# Patient Record
Sex: Male | Born: 1990 | Marital: Single | Smoking: Never smoker
Health system: Southern US, Community
[De-identification: ages and names within clinical notes are randomized; demographics above are authoritative.]

## PROBLEM LIST (undated history)

## (undated) DIAGNOSIS — W3400XA Accidental discharge from unspecified firearms or gun, initial encounter: Secondary | ICD-10-CM

## (undated) DIAGNOSIS — M549 Dorsalgia, unspecified: Secondary | ICD-10-CM

## (undated) DIAGNOSIS — Y249XXA Unspecified firearm discharge, undetermined intent, initial encounter: Secondary | ICD-10-CM

## (undated) HISTORY — DX: Dorsalgia, unspecified: M54.9

## (undated) HISTORY — DX: Accidental discharge from unspecified firearms or gun, initial encounter: W34.00XA

## (undated) HISTORY — DX: Unspecified firearm discharge, undetermined intent, initial encounter: Y24.9XXA

## (undated) HISTORY — PX: OTHER SURGICAL HISTORY: SHX169

---

## 2017-07-07 HISTORY — PX: OTHER SURGICAL HISTORY: SHX169

## 2021-04-06 ENCOUNTER — Ambulatory Visit (INDEPENDENT_AMBULATORY_CARE_PROVIDER_SITE_OTHER): Payer: Self-pay

## 2021-04-06 ENCOUNTER — Encounter (HOSPITAL_COMMUNITY): Payer: Self-pay | Admitting: *Deleted

## 2021-04-06 ENCOUNTER — Other Ambulatory Visit: Payer: Self-pay

## 2021-04-06 ENCOUNTER — Ambulatory Visit (HOSPITAL_COMMUNITY)
Admission: EM | Admit: 2021-04-06 | Discharge: 2021-04-06 | Disposition: A | Discharge status: Home / Self Care | Payer: Self-pay | Attending: Emergency Medicine | Admitting: Emergency Medicine

## 2021-04-06 DIAGNOSIS — S4991XA Unspecified injury of right shoulder and upper arm, initial encounter: Secondary | ICD-10-CM

## 2021-04-06 DIAGNOSIS — W208XXA Other cause of strike by thrown, projected or falling object, initial encounter: Secondary | ICD-10-CM

## 2021-04-06 DIAGNOSIS — M25511 Pain in right shoulder: Secondary | ICD-10-CM

## 2021-04-06 DIAGNOSIS — M79601 Pain in right arm: Secondary | ICD-10-CM

## 2021-04-06 NOTE — ED Triage Notes (Signed)
Pt reports a tree fell on his RT arm yesterday.

## 2021-04-06 NOTE — Discharge Instructions (Signed)
You can take Tylenol and/or ibuprofen as needed for pain relief and fever reduction.  You can use heat, ice, or alternate between heat and ice for comfort.  You may also use IcyHot, lidocaine patches, Biofreeze, Voltaren gel, Bengay, or Aspercreme as needed for pain relief.  Make sure you move your shoulder frequently with stretching and gentle exercises to prevent it from becoming stiff.  Rest as much as possible Ice for 10-15 minutes every 4-6 hours as needed for pain and swelling Compression- use an ace bandage or splint for comfort Elevate above your hip/heart when sitting and laying down  Follow up with sports medicine or orthopedics if symptoms do not improve in the next few days.
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
## 2021-06-27 ENCOUNTER — Ambulatory Visit (HOSPITAL_COMMUNITY)
Admission: EM | Admit: 2021-06-27 | Discharge: 2021-06-27 | Disposition: A | Discharge status: Home / Self Care | Payer: Self-pay | Attending: Family Medicine | Admitting: Family Medicine

## 2021-06-27 ENCOUNTER — Encounter (HOSPITAL_COMMUNITY): Payer: Self-pay | Admitting: Emergency Medicine

## 2021-06-27 ENCOUNTER — Other Ambulatory Visit: Payer: Self-pay

## 2021-06-27 DIAGNOSIS — Z113 Encounter for screening for infections with a predominantly sexual mode of transmission: Secondary | ICD-10-CM | POA: Insufficient documentation

## 2021-06-27 DIAGNOSIS — Z202 Contact with and (suspected) exposure to infections with a predominantly sexual mode of transmission: Secondary | ICD-10-CM

## 2021-06-27 NOTE — Discharge Instructions (Addendum)
-  We'll call in 2-3 days with any positive lab results. °
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
## 2021-06-27 NOTE — ED Provider Notes (Signed)
[facility]    CSN: [id number] Arrival date & time: 06/27/21  1321      History   Chief Complaint No chief complaint on file.   HPI Nicholas Mcgee is a 30 y.o. male presenting following exposure to genital herpes. Medical history noncontributory.  States that he is sexually active with girlfriend who has genital herpes, he is concerned for this though he initially denies any symptoms.  Upon further discussion, he states that he does have some lesions that were diagnosed as a fungal rash of the groin, he is using a cream for this as prescribed from a different urgent care for about 1 week.  He wishes to be swabbed for genital herpes.  He denies exposure to gonorrhea, chlamydia, trichomonas, and he denies any penile discharge or pain or swelling, but is also wishing screening for this.  Denies abdominal pain, flank pain, fever/chills.  HPI  History reviewed. No pertinent past medical history.  There are no problems to display for this patient.   History reviewed. No pertinent surgical history.     Home Medications    Prior to Admission medications   Not on File    Family History History reviewed. No pertinent family history.  Social History Social History   Tobacco Use   Smoking status: Every Day    Types: Cigarettes   Smokeless tobacco: Never     Allergies   Patient has no known allergies.   Review of Systems Review of Systems  Constitutional:  Negative for chills and fever.  HENT:  Negative for sore throat.   Eyes:  Negative for pain and redness.  Respiratory:  Negative for shortness of breath.   Cardiovascular:  Negative for chest pain.  Gastrointestinal:  Negative for abdominal pain, diarrhea, nausea and vomiting.  Genitourinary:  Positive for genital sores. Negative for decreased urine volume, difficulty urinating, dysuria, flank pain, frequency, hematuria, penile discharge, penile pain, penile swelling, scrotal swelling, testicular pain and  urgency.  Musculoskeletal:  Negative for back pain.  Skin:  Negative for rash.  All other systems reviewed and are negative.   Physical Exam Triage Vital Signs ED Triage Vitals [06/27/21 1401]  Enc Vitals Group     BP 122/71     Pulse Rate 78     Resp 17     Temp 98.1 F (36.7 C)     Temp Source Oral     SpO2 98 %     Weight      Height      Head Circumference      Peak Flow      Pain Score 0     Pain Loc      Pain Edu?      Excl. in [place]?    No data found.  Updated Vital Signs BP 122/71    Pulse 78    Temp 98.1 F (36.7 C) (Oral)    Resp 17    SpO2 98%   Visual Acuity Right Eye Distance:   Left Eye Distance:   Bilateral Distance:    Right Eye Near:   Left Eye Near:    Bilateral Near:     Physical Exam Vitals reviewed.  Constitutional:      General: He is not in acute distress.    Appearance: Normal appearance. He is not ill-appearing.  HENT:     Head: Normocephalic and atraumatic.     Mouth/Throat:     Mouth: Mucous membranes are moist.  Comments: Moist mucous membranes Eyes:     Extraocular Movements: Extraocular movements intact.     Pupils: Pupils are equal, round, and reactive to light.  Cardiovascular:     Rate and Rhythm: Normal rate and regular rhythm.     Heart sounds: Normal heart sounds.  Pulmonary:     Effort: Pulmonary effort is normal.     Breath sounds: Normal breath sounds. No wheezing, rhonchi or rales.  Abdominal:     General: Bowel sounds are normal. There is no distension.     Palpations: Abdomen is soft. There is no mass.     Tenderness: There is no abdominal tenderness. There is no right CVA tenderness, left CVA tenderness, guarding or rebound.  Genitourinary:      Comments: Chaperone: Adriana CMA Groin with 3 small patches of dry scaly skin. Skin is easily scraped away revealing faintly erythematous skin. No other lesion or ulceration. No penile or testicular pain or swelling.   Skin:    General: Skin is warm.      Capillary Refill: Capillary refill takes less than 2 seconds.     Comments: Good skin turgor  Neurological:     General: No focal deficit present.     Mental Status: He is alert and oriented to person, place, and time.  Psychiatric:        Mood and Affect: Mood normal.        Behavior: Behavior normal.     UC Treatments / Results  Labs (all labs ordered are listed, but only abnormal results are displayed) Labs Reviewed  HSV CULTURE AND TYPING  CYTOLOGY, (ORAL, ANAL, URETHRAL) ANCILLARY ONLY    EKG   Radiology No results found.  Procedures Procedures (including critical care time)  Medications Ordered in UC Medications - No data to display  Initial Impression / Assessment and Plan / UC Course  I have reviewed the triage vital signs and the nursing notes.  Pertinent labs & imaging results that were available during my care of the patient were reviewed by me and considered in my medical decision making (see chart for details).     This patient is a very pleasant 30 y.o. year old male presenting with exposure to genital herpes. Afebrile, nontachycardic, no reproducible abd pain or CVAT.  HSV swab sent I also collected a swab for G/C/trich.   ED return precautions discussed. Patient verbalizes understanding and agreement.   .   Final Clinical Impressions(s) / UC Diagnoses   Final diagnoses:  Routine screening for STI (sexually transmitted infection)     Discharge Instructions      -We'll call in 2-3 days with any positive lab results.     ED Prescriptions   None    PDMP not reviewed this encounter.   Rhys Martini, PA-C 06/27/21 1513

## 2021-06-28 LAB — CYTOLOGY, (ORAL, ANAL, URETHRAL) ANCILLARY ONLY
Chlamydia: NEGATIVE
Comment: NEGATIVE
Comment: NEGATIVE
Comment: NORMAL
Neisseria Gonorrhea: NEGATIVE
Trichomonas: NEGATIVE

## 2021-06-30 LAB — HSV CULTURE AND TYPING

## 2021-07-10 ENCOUNTER — Ambulatory Visit (HOSPITAL_COMMUNITY)
Admission: EM | Admit: 2021-07-10 | Discharge: 2021-07-10 | Disposition: A | Discharge status: Home / Self Care | Payer: Self-pay | Attending: Family Medicine | Admitting: Family Medicine

## 2021-07-10 ENCOUNTER — Other Ambulatory Visit: Payer: Self-pay

## 2021-07-10 ENCOUNTER — Encounter (HOSPITAL_COMMUNITY): Payer: Self-pay

## 2021-07-10 DIAGNOSIS — Z113 Encounter for screening for infections with a predominantly sexual mode of transmission: Secondary | ICD-10-CM | POA: Insufficient documentation

## 2021-07-10 DIAGNOSIS — B356 Tinea cruris: Secondary | ICD-10-CM | POA: Insufficient documentation

## 2021-07-10 MED ORDER — CLOTRIMAZOLE-BETAMETHASONE 1-0.05 % EX CREA: TOPICAL_CREAM | CUTANEOUS | 0 refills | Status: DC

## 2021-07-10 NOTE — Discharge Instructions (Signed)
We have sent testing for sexually transmitted infections. We will notify you of any positive results once they are received. If required, we will prescribe any medications you might need.  Please refrain from all sexual activity for at least the next seven days.  

## 2021-07-10 NOTE — ED Triage Notes (Signed)
Pt is here for STD testing. He would like blood work today. He is concerned about HSV.

## 2021-07-11 LAB — CYTOLOGY, (ORAL, ANAL, URETHRAL) ANCILLARY ONLY
Chlamydia: NEGATIVE
Comment: NEGATIVE
Comment: NEGATIVE
Comment: NORMAL
Neisseria Gonorrhea: NEGATIVE
Trichomonas: POSITIVE — AB

## 2021-07-11 NOTE — ED Provider Notes (Signed)
[place]   [id number] 07/10/21 Arrival Time: 77  ASSESSMENT & PLAN:  1. Screening for STDs (sexually transmitted diseases)   2. Tinea cruris    No signs of skin infection. Suspect lingering tinea of pubic area. Discussed.  Trial of: Meds ordered this encounter  Medications   clotrimazole-betamethasone (LOTRISONE) cream    Sig: Apply to affected area 2 times daily for one week.    Dispense:  15 g    Refill:  0      Discharge Instructions      We have sent testing for sexually transmitted infections. We will notify you of any positive results once they are received. If required, we will prescribe any medications you might need.  Please refrain from all sexual activity for at least the next seven days.     Pending: Labs Reviewed  CYTOLOGY, (ORAL, ANAL, URETHRAL) ANCILLARY ONLY    Will notify of any positive results. Instructed to refrain from sexual activity for at least seven days.  Reviewed expectations re: course of current medical issues. Questions answered. Outlined signs and symptoms indicating need for more acute intervention. Patient verbalized understanding. After Visit Summary given.   SUBJECTIVE:  Nicholas Mcgee is a 31 y.o. male who requests STI testing. No penile discharge. Does have skin irritation around pubic area; past month or so. No bleeding or drainage. Afebrile. No abdominal or pelvic pain. No n/v. Is sexually active.  OBJECTIVE:  Vitals:   07/10/21 1726  BP: 116/76  Pulse: 77  Resp: 16  Temp: 98.6 F (37 C)  TempSrc: Oral  SpO2: 100%     General appearance: alert, cooperative, appears stated age and no distress Throat: lips, mucosa, and tongue normal; teeth and gums normal Lungs: unlabored respirations; speaks full sentences without difficulty Back: no CVA tenderness; FROM at waist Abdomen: soft, non-tender GU: normal appearing genitalia; small patches around pubic area; question tinea; no signs of bacterial  infection/folliculiltis Skin: warm and dry Psychological: alert and cooperative; normal mood and affect.  Reviewed today: Results for orders placed or performed during the hospital encounter of 06/27/21  Hsv Culture And Typing   Specimen: Penile; Other  Result Value Ref Range   HSV Culture/Type Comment    Source of Sample PENILE   Cytology (oral, anal, urethral) ancillary only  Result Value Ref Range   Neisseria Gonorrhea Negative    Chlamydia Negative    Trichomonas Negative    Comment Normal Reference Range Trichomonas - Negative    Comment Normal Reference [place] Chlamydia - Negative    Comment      Normal Reference Range Neisseria Gonorrhea - Negative    Labs Reviewed  CYTOLOGY, (ORAL, ANAL, URETHRAL) ANCILLARY ONLY    No Known Allergies  History reviewed. No pertinent past medical history. History reviewed. No pertinent family history. Social History   Socioeconomic History   Marital status: Single    Spouse name: Not on file   Number of children: Not on file   Years of education: Not on file   Highest education level: Not on file  Occupational History   Not on file  Tobacco Use   Smoking status: Every Day    Types: Cigarettes   Smokeless tobacco: Never  Substance and Sexual Activity   Alcohol use: Not on file   Drug use: Not on file   Sexual activity: Not on file  Other Topics Concern   Not on file  Social History Narrative   Not on file   Social  Determinants of Health   Financial Resource Strain: Not on file  Food Insecurity: Not on file  Transportation Needs: Not on file  Physical Activity: Not on file  Stress: Not on file  Social Connections: Not on file  Intimate Partner Violence: Not on file           Vanessa Kick, MD 07/11/21 [phone]

## 2021-07-12 ENCOUNTER — Telehealth (HOSPITAL_COMMUNITY): Payer: Self-pay | Admitting: Emergency Medicine

## 2021-07-12 MED ORDER — METRONIDAZOLE 500 MG PO TABS: 2000.0000 mg | ORAL_TABLET | Freq: Once | ORAL | 0 refills | Status: AC

## 2021-07-18 ENCOUNTER — Encounter (HOSPITAL_COMMUNITY): Payer: Self-pay | Admitting: Emergency Medicine

## 2021-07-18 ENCOUNTER — Ambulatory Visit (HOSPITAL_COMMUNITY)
Admission: EM | Admit: 2021-07-18 | Discharge: 2021-07-18 | Disposition: A | Discharge status: Home / Self Care | Payer: Self-pay | Attending: Family Medicine | Admitting: Family Medicine

## 2021-07-18 ENCOUNTER — Other Ambulatory Visit: Payer: Self-pay

## 2021-07-18 DIAGNOSIS — S50811A Abrasion of right forearm, initial encounter: Secondary | ICD-10-CM

## 2021-07-18 DIAGNOSIS — S61452A Open bite of left hand, initial encounter: Secondary | ICD-10-CM

## 2021-07-18 DIAGNOSIS — Z23 Encounter for immunization: Secondary | ICD-10-CM

## 2021-07-18 DIAGNOSIS — S61451A Open bite of right hand, initial encounter: Secondary | ICD-10-CM

## 2021-07-18 DIAGNOSIS — T148XXA Other injury of unspecified body region, initial encounter: Secondary | ICD-10-CM

## 2021-07-18 DIAGNOSIS — A599 Trichomoniasis, unspecified: Secondary | ICD-10-CM

## 2021-07-18 HISTORY — DX: Accidental discharge from unspecified firearms or gun, initial encounter: W34.00XA

## 2021-07-18 HISTORY — DX: Unspecified firearm discharge, undetermined intent, initial encounter: Y24.9XXA

## 2021-07-18 MED ORDER — TETANUS-DIPHTH-ACELL PERTUSSIS 5-2.5-18.5 LF-MCG/0.5 IM SUSY
0.5000 mL | PREFILLED_SYRINGE | Freq: Once | INTRAMUSCULAR | Status: AC
  Administered 2021-07-18: 0.5 mL via INTRAMUSCULAR

## 2021-07-18 MED ORDER — TETANUS-DIPHTH-ACELL PERTUSSIS 5-2.5-18.5 LF-MCG/0.5 IM SUSY
PREFILLED_SYRINGE | INTRAMUSCULAR | Status: AC
  Filled 2021-07-18: qty 0.5

## 2021-07-18 MED ORDER — METRONIDAZOLE 500 MG PO TABS: 2000.0000 mg | ORAL_TABLET | Freq: Once | ORAL | 0 refills | Status: AC
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
## 2021-07-18 NOTE — Discharge Instructions (Signed)
Clean the scrapes and wounds with warm soapy water or hydrogen peroxide 1-2 times daily.  Apply over-the-counter antibiotic ointment to those areas also.  Take metronidazole 500 mg 4 together for 1 dose.  Take it after eating a decent sized meal.  Do not drink alcohol for 72 hours after you have taken this medicine.  Get your partner treated.  Rest from intercourse for 1 to 2 weeks
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
## 2022-09-30 ENCOUNTER — Ambulatory Visit: Payer: BC Managed Care – PPO | Admitting: Family Medicine

## 2022-09-30 ENCOUNTER — Encounter: Payer: Self-pay | Admitting: Family Medicine

## 2022-09-30 VITALS — BP 113/63 | HR 83 | Temp 99.0°F | Ht 71.0 in | Wt 153.7 lb

## 2022-09-30 DIAGNOSIS — Z87828 Personal history of other (healed) physical injury and trauma: Secondary | ICD-10-CM | POA: Insufficient documentation

## 2022-09-30 DIAGNOSIS — R21 Rash and other nonspecific skin eruption: Secondary | ICD-10-CM

## 2022-09-30 DIAGNOSIS — Z7689 Persons encountering health services in other specified circumstances: Secondary | ICD-10-CM

## 2022-09-30 MED ORDER — TRIAMCINOLONE ACETONIDE 0.1 % EX CREA: 1.0000 | TOPICAL_CREAM | Freq: Two times a day (BID) | CUTANEOUS | 1 refills | Status: DC
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
## 2022-10-02 ENCOUNTER — Encounter (HOSPITAL_COMMUNITY): Payer: Self-pay | Admitting: Emergency Medicine

## 2022-10-29 ENCOUNTER — Encounter: Payer: BC Managed Care – PPO | Admitting: Family Medicine

## 2023-01-07 ENCOUNTER — Ambulatory Visit
Admission: EM | Admit: 2023-01-07 | Discharge: 2023-01-07 | Disposition: A | Discharge status: Home / Self Care | Payer: Self-pay | Attending: Emergency Medicine | Admitting: Emergency Medicine

## 2023-01-07 DIAGNOSIS — R21 Rash and other nonspecific skin eruption: Secondary | ICD-10-CM | POA: Insufficient documentation

## 2023-01-07 DIAGNOSIS — L02412 Cutaneous abscess of left axilla: Secondary | ICD-10-CM

## 2023-01-07 DIAGNOSIS — Z113 Encounter for screening for infections with a predominantly sexual mode of transmission: Secondary | ICD-10-CM | POA: Insufficient documentation

## 2023-01-07 MED ORDER — SULFAMETHOXAZOLE-TRIMETHOPRIM 800-160 MG PO TABS: 1.0000 | ORAL_TABLET | Freq: Two times a day (BID) | ORAL | 0 refills | Status: AC

## 2023-01-07 MED ORDER — NYSTATIN 100000 UNIT/GM EX CREA: TOPICAL_CREAM | CUTANEOUS | 0 refills | Status: DC
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
## 2023-01-07 NOTE — ED Triage Notes (Signed)
Patient to Urgent Care with complaints of a possible insect bite present inside his left armpit that occurred two days ago.  Has not had any drainage. Patient reports he did squeeze the area but it increased in size.

## 2023-01-07 NOTE — Discharge Instructions (Addendum)
Take the Bactrim as directed.    Keep the area clean and dry.  Wash it gently twice a day with soap and water.  Apply an antibiotic cream twice a day.    Use the Nystatin cream as directed.  Your STD tests are pending.  Follow up with your primary care provider if your symptoms are not improving.

## 2023-01-09 LAB — CYTOLOGY, (ORAL, ANAL, URETHRAL) ANCILLARY ONLY
Chlamydia: NEGATIVE
Comment: NEGATIVE
Comment: NEGATIVE
Comment: NORMAL
Neisseria Gonorrhea: NEGATIVE
Trichomonas: NEGATIVE

## 2023-04-01 ENCOUNTER — Encounter: Payer: Self-pay | Admitting: Dermatology

## 2023-04-01 ENCOUNTER — Ambulatory Visit (INDEPENDENT_AMBULATORY_CARE_PROVIDER_SITE_OTHER): Payer: Self-pay | Admitting: Dermatology

## 2023-04-01 VITALS — BP 116/77

## 2023-04-01 DIAGNOSIS — L409 Psoriasis, unspecified: Secondary | ICD-10-CM | POA: Diagnosis not present

## 2023-04-01 MED ORDER — CLOBETASOL PROPIONATE 0.05 % EX OINT: 1.0000 | TOPICAL_OINTMENT | Freq: Two times a day (BID) | CUTANEOUS | 2 refills | Status: DC

## 2023-04-01 MED ORDER — CLOBETASOL PROPIONATE 0.05 % EX SOLN: 1.0000 | Freq: Two times a day (BID) | CUTANEOUS | 2 refills | Status: DC

## 2023-04-01 MED ORDER — TACROLIMUS 0.1 % EX OINT: TOPICAL_OINTMENT | Freq: Two times a day (BID) | CUTANEOUS | 2 refills | Status: DC
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
## 2023-04-01 NOTE — Patient Instructions (Addendum)
Hello Nicholas Mcgee,  Thank you for visiting [place] today. We appreciate your commitment to improving your health. Here is a summary of the key instructions from today's consultation:  - Medications for Psoriasis:   - Clobetasol Ointment: Apply twice daily to affected areas for two weeks.   - Tacrolimus Ointment: After a two-week break from Clobetasol, apply Tacrolimus twice daily for another two weeks. Continue alternating between the two every two weeks until improvement is noted.   - Clobetasol Solution for Scalp: Apply twice daily until clear.   - DHS Zinc Shampoo: Use every other day. Apply to the scalp, leave for three minutes, then rinse off.  - Follow-Up Appointment:   - Schedule a follow-up in two months to assess progress. If there is no significant improvement, we will consider starting Otezla, an oral therapy.  - Future Considerations:   - If Nicholas Mcgee does not effectively clear the skin, we may explore injectable biologic treatments, which will require lab work monitoring and TB screening.  Please follow the treatment plan as outlined and do not hesitate to contact the office if you have any questions or concerns. We look forward to seeing you again in two months to evaluate your progress.  Warm regards,  Dr. Langston Mcgee, Dermatology

## 2023-06-01 ENCOUNTER — Ambulatory Visit: Payer: Self-pay | Admitting: Dermatology

## 2023-09-17 ENCOUNTER — Emergency Department
Admission: EM | Admit: 2023-09-17 | Discharge: 2023-09-17 | Disposition: A | Discharge status: Home / Self Care | Attending: Emergency Medicine | Admitting: Emergency Medicine

## 2023-09-17 ENCOUNTER — Other Ambulatory Visit: Payer: Self-pay

## 2023-09-17 DIAGNOSIS — J029 Acute pharyngitis, unspecified: Secondary | ICD-10-CM | POA: Insufficient documentation

## 2023-09-17 DIAGNOSIS — R059 Cough, unspecified: Secondary | ICD-10-CM | POA: Diagnosis present

## 2023-09-17 LAB — RESP PANEL BY RT-PCR (RSV, FLU A&B, COVID)  RVPGX2
Influenza A by PCR: NEGATIVE
Influenza B by PCR: NEGATIVE
Resp Syncytial Virus by PCR: NEGATIVE
SARS Coronavirus 2 by RT PCR: NEGATIVE

## 2023-09-17 LAB — GROUP A STREP BY PCR: Group A Strep by PCR: NOT DETECTED

## 2023-09-17 MED ORDER — IBUPROFEN 600 MG PO TABS: 600.0000 mg | ORAL_TABLET | Freq: Four times a day (QID) | ORAL | 0 refills | Status: AC | PRN

## 2023-09-17 MED ORDER — DEXAMETHASONE 4 MG PO TABS
12.0000 mg | ORAL_TABLET | Freq: Once | ORAL | Status: AC
  Administered 2023-09-17: 12 mg via ORAL
  Filled 2023-09-17: qty 3

## 2023-09-17 MED ORDER — DEXAMETHASONE 6 MG PO TABS: 12.0000 mg | ORAL_TABLET | Freq: Once | ORAL | 0 refills | Status: AC

## 2023-09-17 NOTE — ED Triage Notes (Signed)
 Patient states sore throat for 2 weeks

## 2023-09-17 NOTE — Discharge Instructions (Addendum)
 Your throat pain should start to get better over the next few days.  If it is still painful and uncomfortable on Saturday, take the dexamethasone.  You may take ibuprofen as needed for pain.  If your throat pain is not getting better over the next 1 to 2 weeks, follow-up with the ENT specialist.  Return to the ER for new, worsening, or persistent severe pain, difficulty swallowing, or any other new or worsening symptoms that concern you.

## 2023-09-17 NOTE — ED Provider Notes (Signed)
 [facility] Provider Note    Event Date/Time   First MD Initiated Contact with Patient 09/17/23 [phone]     (approximate)   History   Sore Throat   HPI  Nicholas Mcgee is a 33 y.o. male with a history of psoriasis who presents with sore throat for the last several weeks associated with some cough and nasal congestion.  He reports pain on swallowing but is able to swallow solids and liquids.  He denies any swelling to the neck.  I reviewed the past medical records.  The patient's most recent outpatient encounter was with dermatology for follow-up of psoriasis on 9/25.  He has no recent ED visits.   Physical Exam   Triage Vital Signs: ED Triage Vitals  Encounter Vitals Group     BP 09/17/23 0925 103/65     Systolic BP Percentile --      Diastolic BP Percentile --      Pulse Rate 09/17/23 0924 94     Resp 09/17/23 0924 18     Temp 09/17/23 0926 98.8 F (37.1 C)     Temp Source 09/17/23 0926 Oral     SpO2 09/17/23 0924 100 %     Weight 09/17/23 0923 160 lb (72.6 kg)     Height 09/17/23 0923 5\' 11"  (1.803 m)     Head Circumference --      Peak Flow --      Pain Score 09/17/23 0923 9     Pain Loc --      Pain Education --      Exclude from Growth Chart --     Most recent vital signs: Vitals:   09/17/23 0925 09/17/23 0926  BP: 103/65   Pulse:    Resp:    Temp:  98.8 F (37.1 C)  SpO2:       General: Awake, no distress.  CV:  Good peripheral perfusion.  Resp:  Normal effort.  Abd:  No distention.  Other:  Oropharynx clear with some erythema but no exudates.  No visible swelling.  No pooled secretions.  No stridor.  Normal voice.   ED Results / Procedures / Treatments   Labs (all labs ordered are listed, but only abnormal results are displayed) Labs Reviewed  GROUP A STREP BY PCR  RESP PANEL BY RT-PCR (RSV, FLU A&B, COVID)  [id number]     EKG     RADIOLOGY    PROCEDURES:  Critical Care performed:  No  Procedures   MEDICATIONS ORDERED IN ED: Medications  dexamethasone (DECADRON) tablet 12 mg (has no administration in time range)     IMPRESSION / MDM / ASSESSMENT AND PLAN / ED COURSE  I reviewed the triage vital signs and the nursing notes.  Differential diagnosis includes, but is not limited to, strep pharyngitis, viral pharyngitis, influenza or other viral syndrome.  We will obtain strep swab, respiratory panel, and reassess.  Patient's presentation is most consistent with acute complicated illness / injury requiring diagnostic workup.  ----------------------------------------- 11:36 AM on 09/17/2023 -----------------------------------------  Strep swab is negative.  The patient does not want to wait for the respiratory panel.  He is stable for discharge at this time.  I counseled him on the results of the workup and plan of care.  I have given ENT referral in case the symptoms do not improve and have given a dose of dexamethasone here and prescribe dexamethasone and ibuprofen.  Return precautions provided, and he expresses understanding.  FINAL CLINICAL  IMPRESSION(S) / ED DIAGNOSES   Final diagnoses:  Viral pharyngitis     Rx / DC Orders   ED Discharge Orders          Ordered    dexamethasone (DECADRON) 6 MG tablet   Once        09/17/23 1136    ibuprofen (ADVIL) 600 MG tablet  Every 6 hours PRN        09/17/23 1136             Note:  This document was prepared using Dragon voice recognition software and may include unintentional dictation errors.    Dionne Bucy, MD 09/17/23 1137
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
## 2023-11-18 ENCOUNTER — Other Ambulatory Visit: Payer: Self-pay | Admitting: Dermatology

## 2023-11-18 DIAGNOSIS — L409 Psoriasis, unspecified: Secondary | ICD-10-CM
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
## 2023-12-04 NOTE — Progress Notes (Signed)
 28 day VitalConnect MCT in office hook up applied to pt and explained with both verbal and written instructions provided. Monitor fully activated upon leaving office. PHONE# [phone]

## 2024-01-20 NOTE — Progress Notes (Signed)
  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   I gave patient administration instructions:  -- Pen handling -- Examine medication solution for clarity, if not patient to contact company -- Sterile technique  -- Needle placement on self (stomach or thighs good to rotate areas)  Uncap gray top Sterilize site of administration with alcohol Pinch fat/skin Place needle at a 45 degree Push syringe until solution is gone Pull pen away from administration site (the needle will retrack back into the pen) Place band aid on site.   Patient verbalized understanding.  Patient self administered. Patient stayed for 15 minutes post injection. Patient was discharged without any complications.

## 2024-02-22 ENCOUNTER — Encounter (HOSPITAL_COMMUNITY): Payer: Self-pay

## 2024-02-22 ENCOUNTER — Emergency Department (HOSPITAL_COMMUNITY)
Admission: EM | Admit: 2024-02-22 | Discharge: 2024-02-22 | Disposition: A | Discharge status: Home / Self Care | Attending: Emergency Medicine | Admitting: Emergency Medicine

## 2024-02-22 ENCOUNTER — Other Ambulatory Visit: Payer: Self-pay

## 2024-02-22 ENCOUNTER — Emergency Department (HOSPITAL_COMMUNITY)

## 2024-02-22 DIAGNOSIS — R251 Tremor, unspecified: Secondary | ICD-10-CM | POA: Diagnosis not present

## 2024-02-22 DIAGNOSIS — R569 Unspecified convulsions: Secondary | ICD-10-CM | POA: Insufficient documentation

## 2024-02-22 LAB — URINALYSIS, ROUTINE W REFLEX MICROSCOPIC
Bacteria, UA: NONE SEEN
Bilirubin Urine: NEGATIVE
Glucose, UA: NEGATIVE mg/dL
Ketones, ur: 5 mg/dL — AB
Leukocytes,Ua: NEGATIVE
Nitrite: NEGATIVE
Protein, ur: 30 mg/dL — AB
RBC / HPF: >50 RBC/hpf (ref 0–5)
Specific Gravity, Urine: 1.015 (ref 1.005–1.030)
pH: 5 (ref 5.0–8.0)

## 2024-02-22 LAB — CBC WITH DIFFERENTIAL/PLATELET
Abs Immature Granulocytes: 0.01 K/uL (ref 0.00–0.07)
Basophils Absolute: 0 K/uL (ref 0.0–0.1)
Basophils Relative: 1 %
Eosinophils Absolute: 0.1 K/uL (ref 0.0–0.5)
Eosinophils Relative: 2 %
HCT: 44.6 % (ref 39.0–52.0)
Hemoglobin: 14.9 g/dL (ref 13.0–17.0)
Immature Granulocytes: 0 %
Lymphocytes Relative: 26 %
Lymphs Abs: 1.1 K/uL (ref 0.7–4.0)
MCH: 30.2 pg (ref 26.0–34.0)
MCHC: 33.4 g/dL (ref 30.0–36.0)
MCV: 90.5 fL (ref 80.0–100.0)
Monocytes Absolute: 0.6 K/uL (ref 0.1–1.0)
Monocytes Relative: 15 %
Neutro Abs: 2.2 K/uL (ref 1.7–7.7)
Neutrophils Relative %: 56 %
Platelets: 224 K/uL (ref 150–400)
RBC: 4.93 MIL/uL (ref 4.22–5.81)
RDW: 13.8 % (ref 11.5–15.5)
WBC: 4 K/uL (ref 4.0–10.5)
nRBC: 0 % (ref 0.0–0.2)

## 2024-02-22 LAB — COMPREHENSIVE METABOLIC PANEL WITH GFR
ALT: 12 U/L (ref 0–44)
AST: 30 U/L (ref 15–41)
Albumin: 3.4 g/dL — ABNORMAL LOW (ref 3.5–5.0)
Alkaline Phosphatase: 77 U/L (ref 38–126)
Anion gap: 10 (ref 5–15)
BUN: 11 mg/dL (ref 6–20)
CO2: 21 mmol/L — ABNORMAL LOW (ref 22–32)
Calcium: 8.9 mg/dL (ref 8.9–10.3)
Chloride: 107 mmol/L (ref 98–111)
Creatinine, Ser: 0.92 mg/dL (ref 0.61–1.24)
GFR, Estimated: >60 mL/min (ref >60)
Glucose, Bld: 96 mg/dL (ref 70–99)
Potassium: 4.9 mmol/L (ref 3.5–5.1)
Sodium: 138 mmol/L (ref 135–145)
Total Bilirubin: 0.8 mg/dL (ref 0.0–1.2)
Total Protein: 6.5 g/dL (ref 6.5–8.1)

## 2024-02-22 LAB — ETHANOL: Alcohol, Ethyl (B): <15 mg/dL (ref <15)

## 2024-02-22 LAB — CBG MONITORING, ED: Glucose-Capillary: 94 mg/dL (ref 70–99)

## 2024-02-22 LAB — RAPID URINE DRUG SCREEN, HOSP PERFORMED
Amphetamines: NOT DETECTED
Barbiturates: NOT DETECTED
Benzodiazepines: NOT DETECTED
Cocaine: NOT DETECTED
Opiates: NOT DETECTED
Tetrahydrocannabinol: POSITIVE — AB

## 2024-02-22 LAB — MAGNESIUM: Magnesium: 2.1 mg/dL (ref 1.7–2.4)

## 2024-02-22 MED ORDER — LORAZEPAM 2 MG/ML IJ SOLN
1.0000 mg | Freq: Once | INTRAMUSCULAR | Status: AC
  Administered 2024-02-22: 1 mg via INTRAVENOUS
  Filled 2024-02-22: qty 1

## 2024-02-22 MED ORDER — CHLORDIAZEPOXIDE HCL 25 MG PO CAPS
25.0000 mg | ORAL_CAPSULE | Freq: Once | ORAL | Status: AC
  Administered 2024-02-22: 25 mg via ORAL
  Filled 2024-02-22: qty 1
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
## 2024-02-22 NOTE — Discharge Instructions (Signed)
 Please follow-up with the neurologist in the office.  As we discussed the loss of [place]  State that there should be no driving for 6 months climbing to tall heights swimming or bathing by yourself.  Please return for repeat events.  The neurologist should call you to set up an appointment.

## 2024-02-22 NOTE — ED Notes (Signed)
 CCMD contacted to place the patient on cardiac monitoring services.

## 2024-02-22 NOTE — ED Triage Notes (Signed)
 Pt to ED via [facility] from home. Gf felt him shaking in bed but she was unable to wake him up. Reported the upper body shaking and excessive salivation twice within approximately 10 minutes. EMS reports pt was post-ictal on their arrival. Pt does not have a hx of seizures. Pt had a syncopal episode approximately a month ago as well. Received NS  100/60 98 NSR 98% RA Cbg 83 18g LAC

## 2024-03-17 ENCOUNTER — Encounter: Payer: Self-pay | Admitting: Neurology

## 2024-03-17 ENCOUNTER — Ambulatory Visit (INDEPENDENT_AMBULATORY_CARE_PROVIDER_SITE_OTHER): Payer: Self-pay | Admitting: Neurology

## 2024-03-17 VITALS — BP 100/64 | HR 80 | Ht 71.0 in | Wt 152.0 lb

## 2024-03-17 DIAGNOSIS — R55 Syncope and collapse: Secondary | ICD-10-CM

## 2024-03-17 DIAGNOSIS — Z87828 Personal history of other (healed) physical injury and trauma: Secondary | ICD-10-CM
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
## 2024-03-17 NOTE — Patient Instructions (Addendum)
 You are restricted form driving and operating machinery until the EEG results are back.    Syncope, Adult  Syncope is when you pass out or faint for a short time. It is caused by a sudden decrease in blood flow to the brain. This can happen for many reasons. It can sometimes happen when seeing blood, getting a shot (injection), or having pain or strong emotions. Most causes of fainting are not dangerous, but in some cases it can be a sign of a serious medical problem. If you faint, get help right away. Call your local emergency services (911 in the [place] Follow these instructions at home: Watch for any changes in your symptoms. Take these actions to stay safe and help with your symptoms: Knowing when you may be about to faint Signs that you may be about to faint include: Feeling dizzy or light-headed. It may feel like the room is spinning. Feeling weak. Feeling like you may vomit (nauseous). Seeing spots or seeing all white or all black. Having cold, clammy skin. Feeling warm and sweaty. Hearing ringing in the ears. If you start to feel like you might faint, sit or lie down right away. If sitting, lower your head down between your legs. If lying down, raise (elevate) your feet above the level of your heart. Breathe deeply and steadily. Wait until all of the symptoms are gone. Have someone stay with you until you feel better. Medicines Take over-the-counter and prescription medicines only as told by your doctor. If you are taking blood pressure or heart medicine, sit up and stand up slowly. Spend a few minutes getting ready to sit and then stand. This can help you feel less dizzy. Lifestyle Do not drive, use machinery, or play sports until your doctor says it is okay. Do not drink alcohol. Do not smoke or use any products that contain nicotine or tobacco. If you need help quitting, ask your doctor. Avoid hot tubs and saunas. General instructions Talk with your doctor about your  symptoms. You may need to have testing to help find the cause. Drink enough fluid to keep your pee (urine) pale yellow. Avoid standing for a long time. If you must stand for a long time, do movements such as: Moving your legs. Crossing your legs. Flexing and stretching your leg muscles. Squatting. Keep all follow-up visits. Contact a doctor if: You have episodes of near fainting. Get help right away if: You pass out or faint. You hit your head or are injured after fainting. You have any of these symptoms: Fast or uneven heartbeats (palpitations). Pain in your chest, belly, or back. Shortness of breath. You have jerky movements that you cannot control (seizure). You have a very bad headache. You are confused. You have problems with how you see (vision). You are very weak. You have trouble walking. You are bleeding from your mouth or your butt (rectum). You have black or tarry poop (stool). These symptoms may be an emergency. Get help right away. Call your local emergency services (911 in the [place] Do not wait to see if the symptoms will go away. Do not drive yourself to the hospital. Summary Syncope is when you pass out or faint for a short time. It is caused by a sudden decrease in blood flow to the brain. Signs that you may be about to faint include feeling dizzy or light-headed, feeling like you may vomit, seeing all white or all black, or having cold, clammy skin. If you start to feel like  you might faint, sit or lie down right away. Lower your head if sitting, or raise (elevate) your feet if lying down. Breathe deeply and steadily. Wait until all of the symptoms are gone. This information is not intended to replace advice given to you by your health care provider. Make sure you discuss any questions you have with your health care provider. Document Revised: 11/01/2020 Document Reviewed: 11/01/2020 Elsevier Patient Education  2024 [employer].

## 2024-03-31 ENCOUNTER — Telehealth: Payer: Self-pay | Admitting: Neurology
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
## 2024-04-01 ENCOUNTER — Telehealth: Payer: Self-pay

## 2024-04-01 ENCOUNTER — Ambulatory Visit: Payer: Self-pay

## 2024-04-01 NOTE — Telephone Encounter (Signed)
 This RN contacted [facility] and there are no available same day visits at this time.  Scheduled soonest available appt. ED precautions reviewed, pt verbalized understanding. Also advised to go to call 911/ED if another seizure occurs.  *Please contact pt if PCP would like him to go elsewhere ([facility]), no available appts in regional offices.  FYI Only or Action Required?: FYI only for provider.  Patient was last seen in primary care on 09/30/2022 by Booker Darice SAUNDERS, FNP.  Called Nurse Triage reporting Seizures.  Symptoms began yesterday.  Interventions attempted: Nothing.  Symptoms are: completely resolved.  Triage Disposition: Discuss With PCP and Callback by Nurse Within 1 Hour (overriding See HCP Within 4 Hours (Or PCP Triage))  Patient/caregiver understands and will follow disposition?: Yes Reason for Disposition  Not on or ran out of antiseizure medicine (anticonvulsant)  Answer Assessment - Initial Assessment Questions Patient had a seizure yesterday. Has previous seizure 02/21/24. Patient has a neurologist and is seeing them Oct 7 for EEG. Neuro unable to seen him sooner. They contacted neurologist after his seizure and he advised them to see his PCP.   Wife states that seizure lasted one minute yesterday. Patient was standing and staring out to space. States EMS came to [facility] where he was and pt refused transport to ED. States pt was post ictal for approx 1 minute.   Patient received Stelara injection from derm in July, EMS told pt that this has potential s/e.   1. ONSET: When did the seizure occur?     03/31/24, 02/21/24  2. DURATION: How long did the seizure last (or how long has it been happening)? (e.g., seconds, minutes)  Note: Most seizures last less than 5 minutes.     1 minute  3. DESCRIPTION: Describe what happened during the seizure. Did the body become stiff? Was there any jerking?  Did they lose consciousness during the seizure?     Patient was  standing and staring out to space.  4. CIRCUMSTANCE: What was the person doing when the seizure began?      Standing  5. MENTAL STATUS AFTER SEIZURE: Does the person seem more groggy or sleepy? Does the person know who they are, who you are, and where they are now?      Post ictal for 1 minute  6. PRIOR SEIZURES: Has the person had a seizure (convulsion) before? (e.g., epilepsy, other cause)  If Yes, ask: When was the last time? and What happened last time?      One in August  7. EPILEPSY: Does the person have epilepsy? Note: Check for medical ID bracelet.     Unknown  Protocols used: [facility]   Copied from CRM #[id number]. Topic: Clinical - Red Word Triage >> Apr 01, 2024 10:02 AM Antwanette L wrote: Red Word that prompted transfer to Nurse Triage: Vicci, the pt spouse is calling in on the behalf of the pt. The pt had a seizure yesterday and is experiencing confusion and memory loss.

## 2024-04-01 NOTE — Telephone Encounter (Signed)
 Spoke with patient and informed him that we do not have any open appointments at this time. High advised him that he needs to go to Urgent care or Emergency Department for Observation.  Patient states that he was at work and wanted to know if he could go when he got off at work . Advised patient that he could go then but it is highly recommended that he goes. Patient gave a verbal understanding  and agreed to go after work.

## 2024-04-01 NOTE — Telephone Encounter (Signed)
 Spoke w/Pt regarding the seizure-like activity yesterday. Relayed message from Dr. Chalice regarding EEG appt and f/u with PCP. Pt voiced understanding. Pt asked for his girlfriend to be called - Vicci Lever at [phone] to give her the information as well as he is at work and is not supposed to be on his phone. Agreed to call her. Spoke w/Pt gf Tenoria to give her the information. She voiced understanding. Gf also stated that in July Pt received an injection from his dermatologist and EMS told him that it could have possibly caused him to have a seizure. Vicci stated she could not remember the name of the medication but it is similar to and works like Solara. Informed gf Pt also needs to make his PCP aware of this in the event she decides to start him on an anti-seizure med. Gf voiced understanding and stated Pt had notified his dermatology office what happened and they have stopped all future injections until he has EEG and follow up with neurology.

## 2024-04-05 ENCOUNTER — Ambulatory Visit: Payer: Self-pay | Admitting: Family Medicine

## 2024-04-05 ENCOUNTER — Encounter: Payer: Self-pay | Admitting: Family Medicine

## 2024-04-05 VITALS — BP 108/63 | HR 80 | Temp 98.9°F | Ht 71.0 in | Wt 156.0 lb

## 2024-04-05 DIAGNOSIS — Z87828 Personal history of other (healed) physical injury and trauma: Secondary | ICD-10-CM | POA: Diagnosis not present

## 2024-04-05 DIAGNOSIS — R55 Syncope and collapse: Secondary | ICD-10-CM | POA: Diagnosis not present
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
## 2024-04-05 NOTE — Assessment & Plan Note (Addendum)
 Reports episodes of passing out without warning. 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  He was told he should avoid driving and operating heavy equipment by neuro until EEG is done. This was reiterated that he should not drive his car or use a forklift. Work note provided.  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 He reports he has seen cards and wore continuous heart monitor for 4 weeks without abnormal findings. EKG at hospital did not show acute findings.  Recommend filing for FMLA until he has a definitely diagnosis from neurology.

## 2024-04-12 ENCOUNTER — Encounter: Payer: Self-pay | Admitting: *Deleted

## 2024-04-12 ENCOUNTER — Telehealth: Payer: Self-pay | Admitting: Neurology

## 2024-04-12 ENCOUNTER — Ambulatory Visit (INDEPENDENT_AMBULATORY_CARE_PROVIDER_SITE_OTHER): Payer: Self-pay | Admitting: Neurology

## 2024-04-12 DIAGNOSIS — R55 Syncope and collapse: Secondary | ICD-10-CM | POA: Diagnosis not present

## 2024-04-12 DIAGNOSIS — Z87828 Personal history of other (healed) physical injury and trauma: Secondary | ICD-10-CM

## 2024-04-12 NOTE — Telephone Encounter (Signed)
 error

## 2024-04-21 ENCOUNTER — Ambulatory Visit: Payer: Self-pay | Admitting: Neurology

## 2024-04-21 NOTE — Procedures (Signed)
 [facility]  EEG (ELECTROENCEPHALOGRAM) REPORT  Jesstin Whittaker    ORDERING CLINICIAN: Dedra Gores, M.D.  TECHNOLOGIST: , Burnard suitor  REEGT TECHNIQUE:  This EEG study was performed according to the 10-20 International system of electrode placement. Electrical activity was reviewed with band pass filter of 1-70Hz , sensitivity of 7 uV/mm, display speed of 24mm/sec with a 60Hz  notched filter applied as appropriate. EEG data were recorded continuously and digitally stored.    Recoding duration : 25 m 1 sec Activation included:  Photic stimulation and Hyperventilation  .      Description: The EEG's posterior dominant background rhythm of 10 hertz was of very low amplitude, symmetrically displayed with high frequency fronto-polar activity.  Photic stimulation was initiated at frequencies from 3- through 21 hertz, resulting in some limited  photic entrainment at  9, 11 and 13 hertz  ,  without periodic or rhythmic discharges, or epileptiform activity.  Hyperventilation maneuver was initiated,  leading to amplitude build-up without  slowing.  Following hyperventilation the patient's EEG was reviewed for a period of 1 and 2 minutes post maneuver, and showed  finally some relaxation but no sleep.    The patient was recorded while awake,    ECG: regular heart rate at 60 bpm   IMPRESSION:  This is a normal EEG due to lack of epileptiform discharges.   There was little reactivity to hyperventilation and to photic stimulation noted.  No sleep was recorded.    Dr. Dedra Gores, M.D. Accredited by the ABPN, ABSM.
